# Patient Record
Sex: Female | Born: 1995 | Smoking: Never smoker
Health system: Southern US, Community
[De-identification: ages and names within clinical notes are randomized; demographics above are authoritative.]

## PROBLEM LIST (undated history)

## (undated) DIAGNOSIS — Z789 Other specified health status: Secondary | ICD-10-CM

---
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
## 2017-11-03 IMAGING — US US OB TRANSVAGINAL
1 series · 13 of 28 positions shown · non-contrast
Comparison: None.

CLINICAL DATA: 19-year-old with bilateral pelvic pain and vaginal
bleeding today. After the ordered pelvic examination was completed,
it was discovered that the patient is pregnant with a beta HCG level
of [DATE]. Examination was changed to an obstetric ultrasound.

EXAM:
OBSTETRIC <14 WK US AND TRANSVAGINAL OB US
TECHNIQUE: Both transabdominal and transvaginal ultrasound examinations were
performed for complete evaluation of the gestation as well as the
maternal uterus, adnexal regions, and pelvic cul-de-sac.
Transvaginal technique was performed to assess early pregnancy.

[Series 1: us ob transvaginal · 0.24mm/px · 13 of 59 slices shown]
[im 3/59]
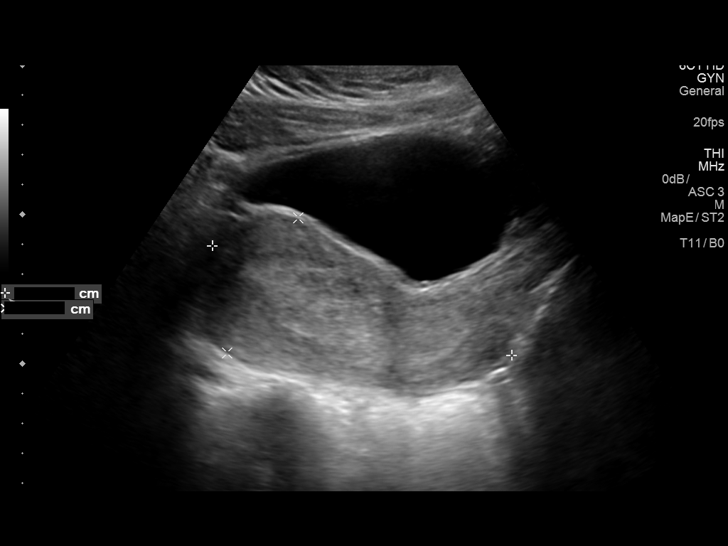
[im 7/59]
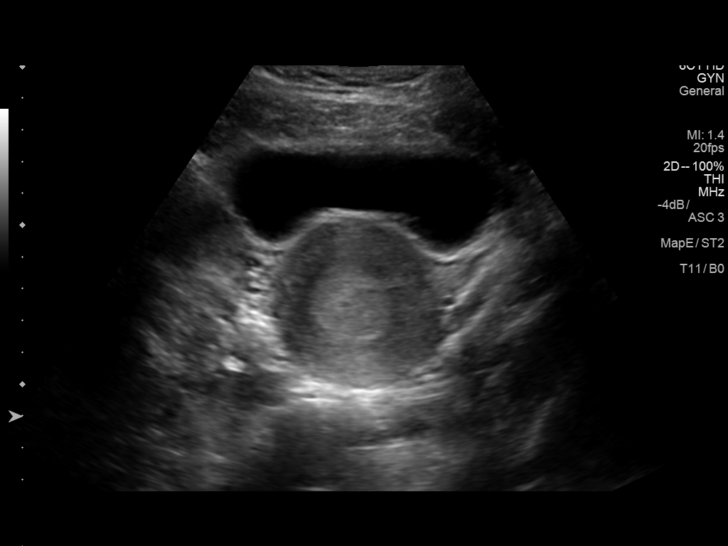
[im 11/59]
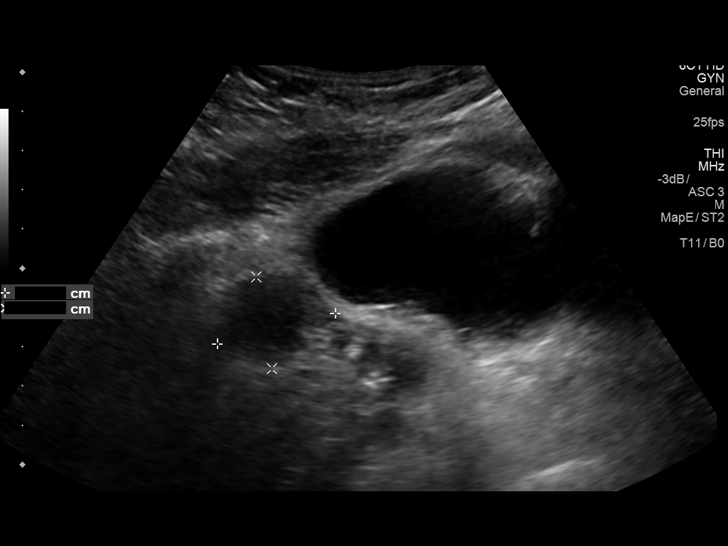
[im 16/59]
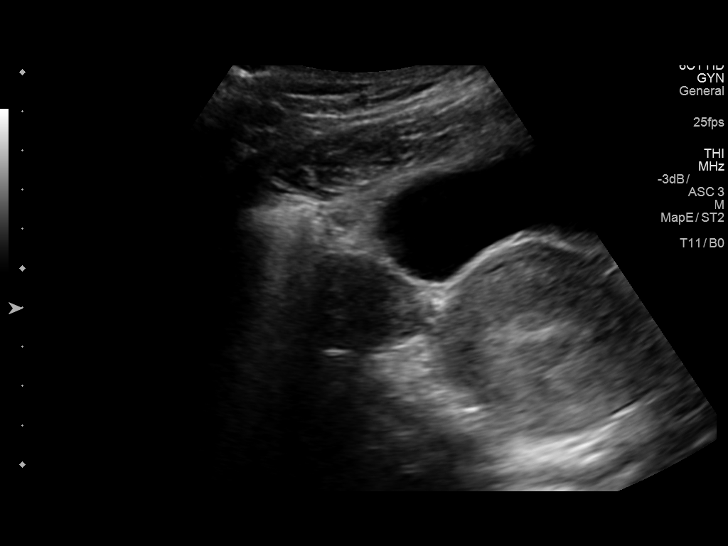
[im 20/59]
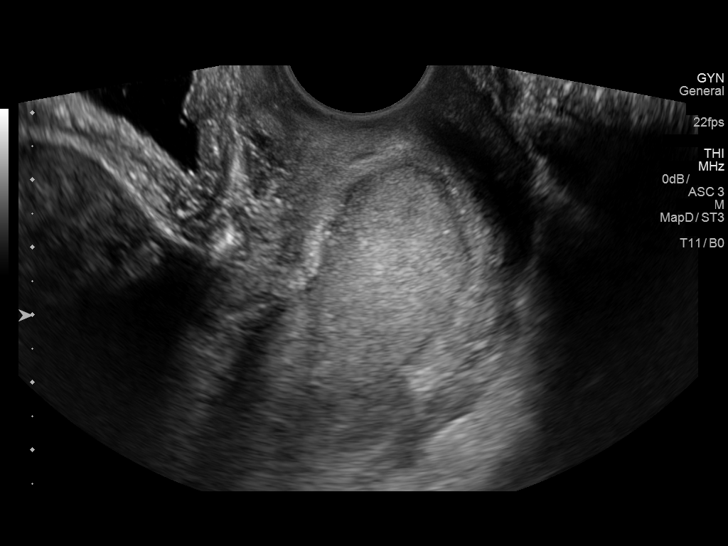
[im 24/59]
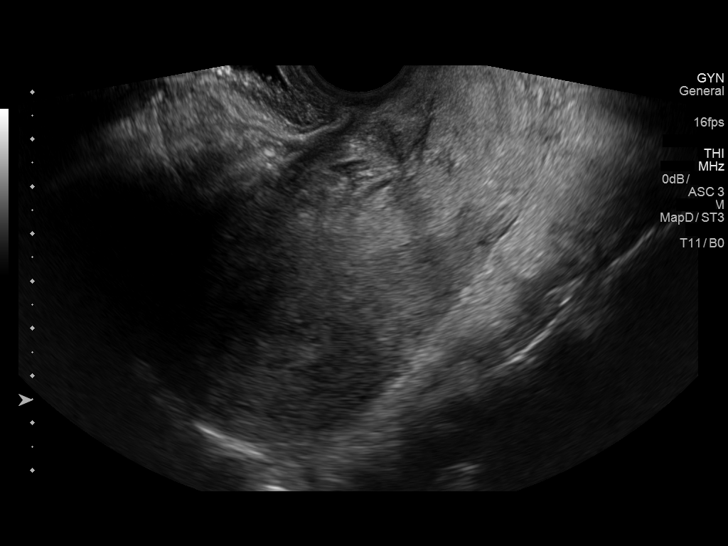
[im 31/59]
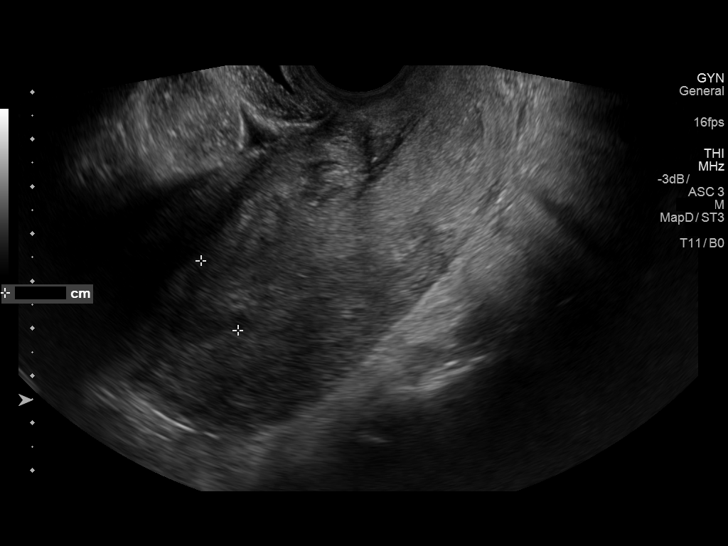
[im 35/59]
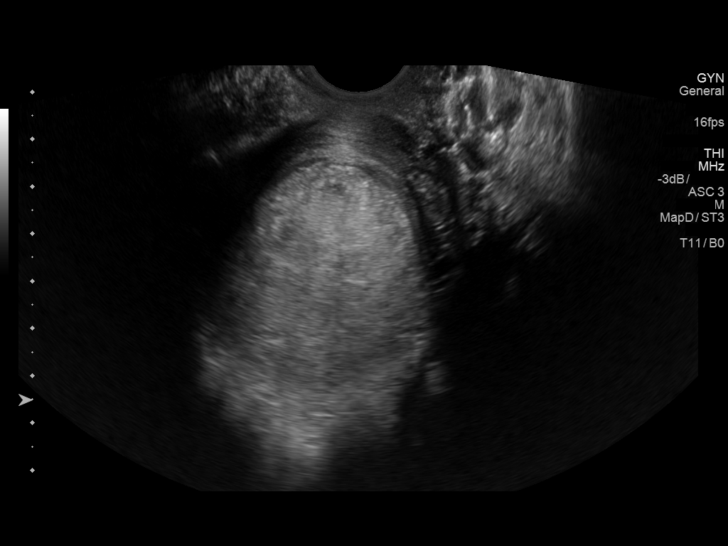
[im 39/59]
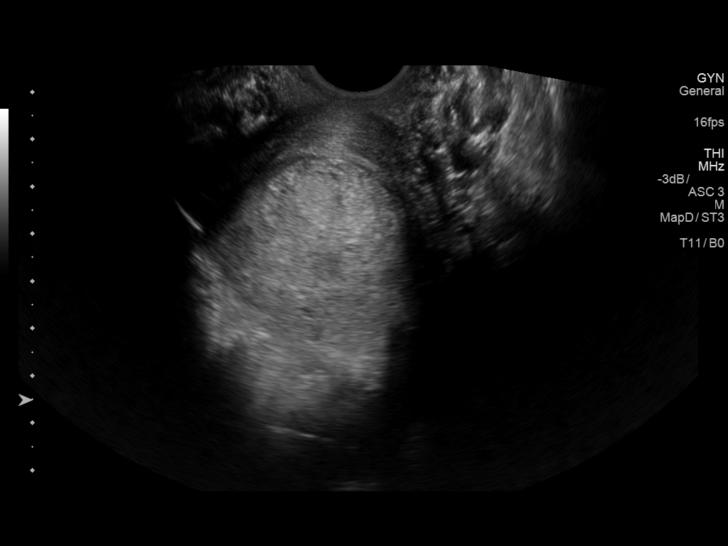
[im 43/59]
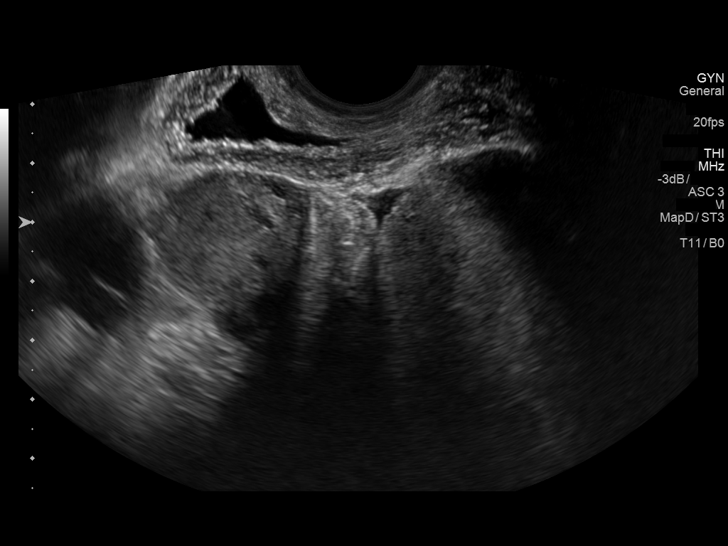
[im 48/59]
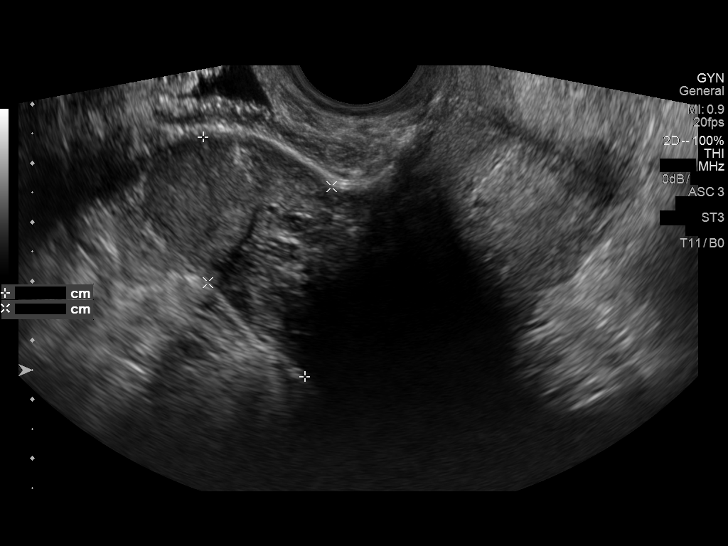
[im 52/59]
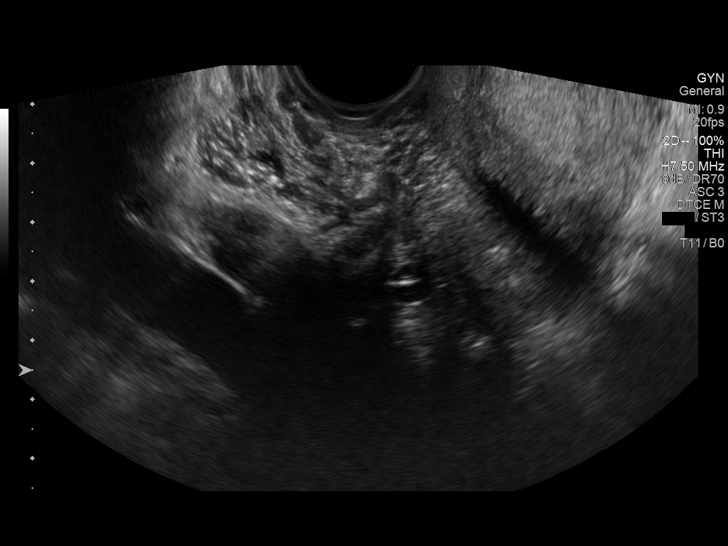
[im 56/59]
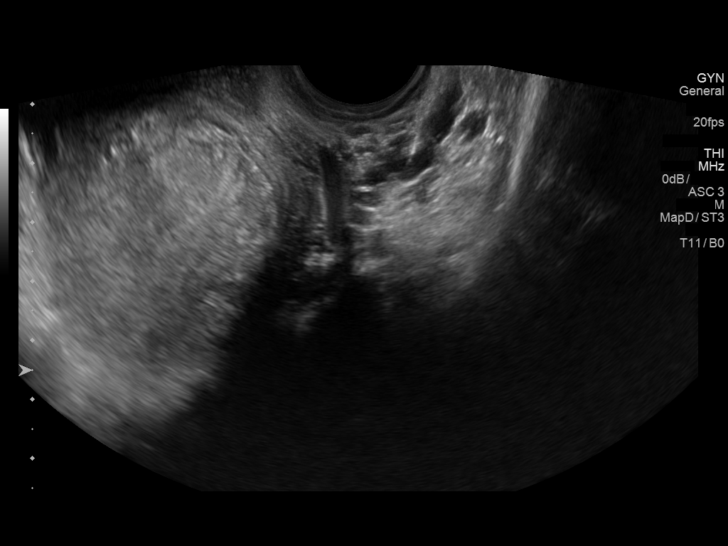

[13 of 28 positions shown; findings below may reference images not displayed]

FINDINGS: Intrauterine gestational sac: None visualized. The endometrium is
thickened to 1.7 cm. Echogenic mass-like material in the lower
uterine segment most likely represents blood clot.

Yolk sac:  Not visualized.

Embryo:  Not visualized.

Maternal uterus/adnexae: There is a probable corpus luteal cyst on
the right measuring 2.0 cm maximally. The left ovary is not
visualized. There is no evidence of adnexal mass. Trace free pelvic
fluid is present.
IMPRESSION: Endometrial thickening with probable blood clot in the lower uterine
segment. No intrauterine gestational sac, yolk sac, fetal pole, or
cardiac activity visualized. Differential considerations include
intrauterine gestation too early to be sonographically visualized,
spontaneous abortion (favored), or ectopic pregnancy. Consider
follow-up ultrasound in 14 days and serial quantitative beta HCG
follow-up.
